# Patient Record
Sex: Female | Born: 1960 | Race: White | Hispanic: No | Marital: Married
Health system: Southern US, Community
[De-identification: ages and names within clinical notes are randomized; demographics above are authoritative.]

## PROBLEM LIST (undated history)

## (undated) HISTORY — PX: ABDOMINAL HYSTERECTOMY: SHX81

---

## 2000-01-24 ENCOUNTER — Other Ambulatory Visit: Admission: RE | Admit: 2000-01-24 | Discharge: 2000-01-24 | Payer: Self-pay | Admitting: Family Medicine

## 2002-01-18 ENCOUNTER — Other Ambulatory Visit: Admission: RE | Admit: 2002-01-18 | Discharge: 2002-01-18 | Payer: Self-pay | Admitting: Obstetrics and Gynecology

## 2003-07-05 ENCOUNTER — Other Ambulatory Visit: Admission: RE | Admit: 2003-07-05 | Discharge: 2003-07-05 | Payer: Self-pay | Admitting: Obstetrics and Gynecology

## 2003-07-27 ENCOUNTER — Ambulatory Visit (HOSPITAL_COMMUNITY): Admission: RE | Admit: 2003-07-27 | Discharge: 2003-07-27 | Payer: Self-pay | Admitting: Obstetrics and Gynecology

## 2003-08-29 ENCOUNTER — Encounter (INDEPENDENT_AMBULATORY_CARE_PROVIDER_SITE_OTHER): Payer: Self-pay | Admitting: *Deleted

## 2003-08-29 ENCOUNTER — Observation Stay (HOSPITAL_COMMUNITY): Admission: RE | Admit: 2003-08-29 | Discharge: 2003-08-30 | Payer: Self-pay | Admitting: Obstetrics and Gynecology

## 2003-08-29 DIAGNOSIS — Z90710 Acquired absence of both cervix and uterus: Secondary | ICD-10-CM | POA: Insufficient documentation

## 2009-04-28 ENCOUNTER — Emergency Department (HOSPITAL_COMMUNITY): Admission: EM | Admit: 2009-04-28 | Discharge: 2009-04-28 | Payer: Self-pay | Admitting: Emergency Medicine

## 2010-12-23 LAB — URINALYSIS, ROUTINE W REFLEX MICROSCOPIC
Bilirubin Urine: NEGATIVE
Glucose, UA: NEGATIVE mg/dL
Ketones, ur: NEGATIVE mg/dL
Leukocytes, UA: NEGATIVE
Nitrite: POSITIVE — AB
Protein, ur: NEGATIVE mg/dL
Specific Gravity, Urine: >1.03 — ABNORMAL HIGH (ref 1.005–1.030)
Urobilinogen, UA: 0.2 mg/dL (ref 0.0–1.0)
pH: 5 (ref 5.0–8.0)

## 2010-12-23 LAB — URINE CULTURE: Colony Count: >=100000

## 2010-12-23 LAB — URINE MICROSCOPIC-ADD ON

## 2011-02-01 NOTE — Op Note (Signed)
Sheila Arnold, Sheila Arnold                           ACCOUNT NO.:  [id number]   MEDICAL RECORD NO.:  [id number]                   PATIENT TYPE:  OBV   LOCATION:  9302                                 FACILITY:  [facility]   PHYSICIAN:  Maxie Better, M.D.            DATE OF BIRTH:  01/13/1961   DATE OF PROCEDURE:  08/29/2003  DATE OF DISCHARGE:                                 OPERATIVE REPORT   PREOPERATIVE DIAGNOSIS:  Second-degree uterine prolapse.   PROCEDURE:  Total vaginal hysterectomy.   POSTOPERATIVE DIAGNOSIS:  Second-degree uterine prolapse.   ANESTHESIA:  General.   SURGEON:  Maxie Better, M.D.   ASSISTANT:  Pershing Cox, M.D.   INDICATIONS:  This is a 50 year old gravida 2, para 2, married, white female  with symptomatic uterine prolapse, who now presents for surgical management.  Risks and benefits of the procedure have been explained to the patient.  Consent was signed.  The patient was transferred to the operating room.   DESCRIPTION OF PROCEDURE:  Under adequate general anesthesia, the patient  was placed in the dorsal lithotomy position.  Examination under anesthesia  revealed an 8 weeks size, anteverted uterus; no adnexal masses could be  appreciated.  The patient was sterilely prepped and draped in the usual  fashion.  An indwelling Foley catheter was then placed.  A weighted speculum  was placed in the vagina.  A vaginal retractor was then used anteriorly.  The cervical os, which was gaping, was noted to be at the entrance of the  vagina.  The anterior lip and the posterior lip of the cervix was grasped  with Brooke Pace clamps.  The cervicovaginal junction was then  recognized/identified.  A circumferential incision was then made at the  cervicovaginal junction.  Using Mayo scissors, sharp dissection was then  used anteriorly to open the anterior cul-de-sac.  Posteriorly, after  dissection superiorly, the posterior cul-de-sac was then opened.  The  uterosacral ligaments were bilaterally clamped, cut, and suture ligated with  0 Vicryl suture bilaterally.  Using the long LigaSure, the  cardinal  ligaments were bilaterally clamped, cauterized, and cut.  The uterine  vessels were bilaterally clamped, cauterized, and cut.  This procedure was  continued up until the level of the uteroovarian ligaments was reached, at  which time decision was then made to transect the specimen in the midline up  to the fundal portion without completely cutting it in half.  This allowed  for the left uteroovarian ligament to be identified which was then serially  clamped, cut using the LigaSure.  This procedure is performed also on the  contralateral side.  Normal tubes and ovaries are noted bilaterally.  The  posterior vaginal cuff was then noted to have some bleeding.  This was  hemostased with cauterization, and the posterior vaginal cuff __________  with 0 Vicryl running lock stitch.  It was noted that bilaterally the  uterosacral ligaments  were slightly attenuated particularly on the left, and  this was reinforced with 0 Vicryl figure-of-eight sutures being placed at  the previous uterosacral ligament pedicle and tied to the preexisting one.  A McCall culdoplasty was then done from the uterosacral ligaments on the  left to the right using 0 Vicryl x 2.  This was then tied x 2 in the midline  to decrease enterocele formation.  The peritoneum anteriorly was then  identified and brought up to the level of the side of the vagina anteriorly.  This was then affixed to the anterior aspect of the vagina using 0 Vicryl  single suture.  The pedicles were inspected.  Good hemostasis was noted.  The decision was then made to close the vaginal cuff.  This was closed  vertically using interrupted 0 Vicryl figure-of-eights and single sutures.  The uterosacral ligaments were then tied in the midline prior to complete  closure of the vagina.  The specimen was the uterus  with the cervix,  bivalve.  The vagina was inspected.  Good hemostasis was noted at which time  all instruments were then removed from the vagina.  Specimen was, as stated,  uterus and cervix.  Estimated blood loss was about 75-100 mL.  Intraoperative fluid was 1200 mL crystalloid.  Urine output was 200 mL clear  yellow urine.  Sponge and instruments counts x 2 is correct.  Complication  was none.  The patient tolerated the procedure well, was transferred to the  recovery room in stable condition.                                               Maxie Better, M.D.    [place]/MEDQ  D:  08/29/2003  T:  08/29/2003  Job:  [id number]

## 2011-02-01 NOTE — Op Note (Signed)
NAMEJAIDYNN, Sheila Arnold                           ACCOUNT NO.:  [id number]   MEDICAL RECORD NO.:  [id number]                   PATIENT TYPE:  OBV   LOCATION:  9302                                 [facility]   PHYSICIAN:  Maxie Better, M.D.            DATE OF BIRTH:  02/15/1961   DATE OF PROCEDURE:  08/29/2003  DATE OF DISCHARGE:                                 OPERATIVE REPORT   PREOPERATIVE DIAGNOSIS:  Second-degree uterine prolapse.   PROCEDURE:  Total vaginal hysterectomy.   POSTOPERATIVE DIAGNOSIS:  Second-degree uterine prolapse.   ANESTHESIA:  General.   SURGEON:  Maxie Better, M.D.   ASSISTANT:  Pershing Cox, M.D.   INDICATIONS:  A 50 year old gravida 2, para 2 female, with symptomatic  uterine prolapse is now being admitted for surgical management.  Risks and  benefits of the procedure have been explained to the patient.  Consent was  signed.  The patient was transferred to the operating room.   DESCRIPTION OF PROCEDURE:  Under adequate general anesthesia, the patient  was placed in the dorsal lithotomy position.  She was sterilely prepped and  draped in the usual fashion for a vaginal hysterectomy.  An indwelling Foley  catheter was sterilely placed.  Bimanual examination revealed anteverted  uterus about 8 weeks size.  No adnexal masses could be appreciated.  A  weighted speculum was placed in the vagina.  A vaginal retractor was then  used anteriorly.  The cervix was noted to be at the entrance to the vagina  and with notable gaping, cervical os.  The anterior and posterior lip of the  cervix was grasped with Brooke Pace clamps on the anterior and posterior lip.  Traction was then placed, and countertraction to assess the junction between  the vagina and the cervix.  A dilute solution of Pitressin was injected  circumferentially around the cervicovaginal junction.  A circumferential  incision was then made and then superiorly using Mayo scissors, the  anterior  cul-de-sac was subsequently opened.  The posterior cul-de-sac after  dissection up superiorly was then opened.  The uterosacral ligaments were  then bilaterally clamped, cut, and suture ligated with 0 Vicryl Heaney  sutures.  The LigaSure  Dictation ends at this point.                                               Maxie Better, M.D.    [place]/MEDQ  D:  08/29/2003  T:  08/29/2003  Job:  [id number]

## 2011-02-01 NOTE — H&P (Signed)
Sheila Arnold, Sheila Arnold                           ACCOUNT NO.:  [id number]   MEDICAL RECORD NO.:  [id number]                   PATIENT TYPE:  OBV   LOCATION:  NA                                   [facility]   PHYSICIAN:  Maxie Better, M.D.            DATE OF BIRTH:  27-Jan-1961   DATE OF ADMISSION:  DATE OF DISCHARGE:                                HISTORY & PHYSICAL   ANTICIPATED DATE OF ADMISSION AND SURGERY:  August 29, 2003.   CHIEF COMPLAINT:  Uterine prolapse and intramenstrual bleed.   HISTORY OF PRESENT ILLNESS:  This is a 50 year old gravida 2,  para 2  married white female whose last menstrual period was August 08, 2003 and  is being admitted for a total vaginal hysterectomy secondary to second-  degree uterine prolapse.  The patient has no associated urinary  incontinence.  She presents with complaints of postcoital bleeding and  intramenstrual bleeding.  Her evaluation included endometrial biopsy on  July 21, 2003 that showed proliferative endometrium.  Pap smear July 05, 2003 was normal.  Ultrasound on July 19, 2003 showed a thickened  endometrium with an endometrial stripe of 1.1 cm, normal ovaries and normal  uterus.  Sonohystogram  was performed; no endometrial polyps or endometrial  cavity lesions noted.   The patient was offered conservative management including a pessary, which  she declined.  Her husband has had a vasectomy.   ALLERGIES:  No known drug allergies.   MEDICATIONS:  Medicines are multivitamins.   PAST MEDICAL HISTORY:  Migraine headaches.   PAST SURGICAL HISTORY:  Right shoulder surgery.   PAST OBSTETRICAL HISTORY:  Vaginal delivery times two.   FAMILY HISTORY:  Mother; diabetes.  No genital, colon or ovarian cancer.   SOCIAL HISTORY:  Married.  Two children.  Nonsmoker.  Homemaker.   REVIEW OF SYSTEMS:  Review of systems is negative, except as noted in the  history of present illness.   PHYSICAL EXAMINATION:   GENERAL APPEARANCE:  Well-developed, well-nourished  white female in no acute distress.  VITAL SIGNS:  Blood pressure 120/74, pulse of 86, weight 171 pounds, and  height 5 feet 3 inches.  SKIN:  Skin shows no lesions.  HEENT EXAMINATION:  Anicteric sclerae.  Pink conjunctivae.  Oropharynx  negative.  HEART:  Heart is regular rate and rhythm without murmur.  LUNGS:  Lungs are clear to auscultation.  BREASTS:  Breasts are soft and nontender with no palpable mass.  Upright  nipples.  NECK:  Neck is supple.  No thyroid palpable.  NODES:  No supraclavicular, axillary or inguinal nodes palpable.  BACK:  No CVA tenderness.  ABDOMEN:  Abdomen soft and nontender.  No palpable mass.  PELVIC EXAMINATION:  Vulva shows no lesions.  Vagina has good rugae.  Second-  degree uterine prolapse with Valsalva.  Cervix has a gaping os, otherwise  normal.  Uterus is anteverted, normal and  nontender.  Adnexa nontender, no  palpable mass.  RECTAL:  Rectal exam deferred, although protuberant hemorrhoid noted.   IMPRESSION:  Second-degree uterine prolapse without any other associated  pelvic relaxation disorder.   PLAN:  1. Admission.  2. Total vaginal hysterectomy with preservation of ovarian function.  3. Routine admission labs and orders.  4. Antibiotics for prophylaxis.  5. Antiembolic stockings.   The risks of the procedure have been explained to the patient including, but  not limited to infection, bleeding, bleeding that may require blood  transfusion, risks of blood transfusion, HIV transmission, acute reaction,  hepatitis transmission, injury to surrounding organ structures such as the  bladder, bowel or ureters, fistula formation, bowel obstruction, internal  scar tissue that may lead to pelvic pain, preservation of the ovaries, pros  and cons discussed including possible surgery in the future for ovarian  cysts as well as ovarian cancer, inability to complete the procedure  vaginally and need  for transabdominal route discussed, and no further menses  were discussed.  Postop care and criteria for discharge home were reviewed.  All questions were answered.                                               Maxie Better, M.D.    [place]/MEDQ  D:  08/28/2003  T:  08/28/2003  Job:  [id number]

## 2013-07-07 ENCOUNTER — Other Ambulatory Visit (HOSPITAL_COMMUNITY): Payer: Self-pay | Admitting: Family Medicine

## 2013-07-07 DIAGNOSIS — M810 Age-related osteoporosis without current pathological fracture: Secondary | ICD-10-CM

## 2013-07-07 DIAGNOSIS — Z139 Encounter for screening, unspecified: Secondary | ICD-10-CM

## 2013-07-20 ENCOUNTER — Ambulatory Visit (HOSPITAL_COMMUNITY): Payer: Self-pay

## 2013-07-20 ENCOUNTER — Other Ambulatory Visit (HOSPITAL_COMMUNITY): Payer: Self-pay

## 2013-08-25 ENCOUNTER — Telehealth: Payer: Self-pay

## 2013-08-25 NOTE — Telephone Encounter (Signed)
Pt was referred by Marin Olp , NP, for screening colonoscopy. She has constipation and has been scheduled appt for OV with Merleen Nicely, NP on 09/23/2013 at 3:00 PM.

## 2013-08-26 ENCOUNTER — Ambulatory Visit (HOSPITAL_COMMUNITY)
Admission: RE | Admit: 2013-08-26 | Discharge: 2013-08-26 | Disposition: A | Discharge status: Home / Self Care | Payer: BC Managed Care – PPO | Source: Ambulatory Visit | Attending: Family Medicine | Admitting: Family Medicine

## 2013-08-26 DIAGNOSIS — Z139 Encounter for screening, unspecified: Secondary | ICD-10-CM

## 2013-08-26 DIAGNOSIS — Z1231 Encounter for screening mammogram for malignant neoplasm of breast: Secondary | ICD-10-CM | POA: Insufficient documentation

## 2013-09-02 ENCOUNTER — Other Ambulatory Visit (HOSPITAL_COMMUNITY): Payer: Self-pay | Admitting: Family Medicine

## 2013-09-02 DIAGNOSIS — R928 Other abnormal and inconclusive findings on diagnostic imaging of breast: Secondary | ICD-10-CM

## 2013-09-22 ENCOUNTER — Encounter (HOSPITAL_COMMUNITY): Payer: BC Managed Care – PPO

## 2013-09-23 ENCOUNTER — Ambulatory Visit: Payer: Self-pay | Admitting: Gastroenterology

## 2015-09-14 ENCOUNTER — Other Ambulatory Visit (HOSPITAL_COMMUNITY): Payer: Self-pay | Admitting: Adult Health Nurse Practitioner

## 2015-09-14 DIAGNOSIS — E785 Hyperlipidemia, unspecified: Secondary | ICD-10-CM | POA: Insufficient documentation

## 2015-09-14 DIAGNOSIS — Z1231 Encounter for screening mammogram for malignant neoplasm of breast: Secondary | ICD-10-CM

## 2015-09-14 DIAGNOSIS — K5900 Constipation, unspecified: Secondary | ICD-10-CM | POA: Insufficient documentation

## 2015-09-19 ENCOUNTER — Other Ambulatory Visit (HOSPITAL_COMMUNITY): Payer: Self-pay | Admitting: Adult Health Nurse Practitioner

## 2015-09-19 DIAGNOSIS — R229 Localized swelling, mass and lump, unspecified: Principal | ICD-10-CM

## 2015-09-19 DIAGNOSIS — IMO0002 Reserved for concepts with insufficient information to code with codable children: Secondary | ICD-10-CM

## 2015-09-22 ENCOUNTER — Ambulatory Visit (HOSPITAL_COMMUNITY): Payer: Self-pay

## 2015-09-28 ENCOUNTER — Other Ambulatory Visit (HOSPITAL_COMMUNITY): Payer: Self-pay | Admitting: Adult Health Nurse Practitioner

## 2015-09-28 DIAGNOSIS — R229 Localized swelling, mass and lump, unspecified: Principal | ICD-10-CM

## 2015-09-28 DIAGNOSIS — IMO0002 Reserved for concepts with insufficient information to code with codable children: Secondary | ICD-10-CM

## 2015-10-03 ENCOUNTER — Ambulatory Visit (HOSPITAL_COMMUNITY)
Admission: RE | Admit: 2015-10-03 | Discharge: 2015-10-03 | Disposition: A | Discharge status: Home / Self Care | Payer: BLUE CROSS/BLUE SHIELD | Source: Ambulatory Visit | Attending: Adult Health Nurse Practitioner | Admitting: Adult Health Nurse Practitioner

## 2015-10-03 DIAGNOSIS — R229 Localized swelling, mass and lump, unspecified: Secondary | ICD-10-CM

## 2015-10-03 DIAGNOSIS — IMO0002 Reserved for concepts with insufficient information to code with codable children: Secondary | ICD-10-CM

## 2015-10-03 DIAGNOSIS — N63 Unspecified lump in breast: Secondary | ICD-10-CM | POA: Diagnosis not present

## 2015-10-03 DIAGNOSIS — R928 Other abnormal and inconclusive findings on diagnostic imaging of breast: Secondary | ICD-10-CM | POA: Diagnosis present

## 2016-06-17 DIAGNOSIS — E669 Obesity, unspecified: Secondary | ICD-10-CM | POA: Insufficient documentation

## 2018-02-19 DIAGNOSIS — M545 Low back pain, unspecified: Secondary | ICD-10-CM | POA: Insufficient documentation

## 2018-08-20 ENCOUNTER — Other Ambulatory Visit (HOSPITAL_COMMUNITY): Payer: Self-pay | Admitting: Adult Health Nurse Practitioner

## 2018-08-20 DIAGNOSIS — Z1231 Encounter for screening mammogram for malignant neoplasm of breast: Secondary | ICD-10-CM

## 2018-08-31 ENCOUNTER — Ambulatory Visit (HOSPITAL_COMMUNITY)
Admission: RE | Admit: 2018-08-31 | Discharge: 2018-08-31 | Disposition: A | Discharge status: Home / Self Care | Payer: BLUE CROSS/BLUE SHIELD | Source: Ambulatory Visit | Attending: Adult Health Nurse Practitioner | Admitting: Adult Health Nurse Practitioner

## 2018-08-31 DIAGNOSIS — Z1231 Encounter for screening mammogram for malignant neoplasm of breast: Secondary | ICD-10-CM | POA: Diagnosis present

## 2018-08-31 DIAGNOSIS — M2550 Pain in unspecified joint: Secondary | ICD-10-CM | POA: Insufficient documentation

## 2020-03-05 IMAGING — MG DIGITAL SCREENING BILATERAL MAMMOGRAM WITH TOMO AND CAD
8 series · 8 of 24 positions shown · non-contrast
Comparison: Previous exam(s).

CLINICAL DATA: Screening.

EXAM:
DIGITAL SCREENING BILATERAL MAMMOGRAM WITH TOMO AND CAD

[R MLO synth-2D]
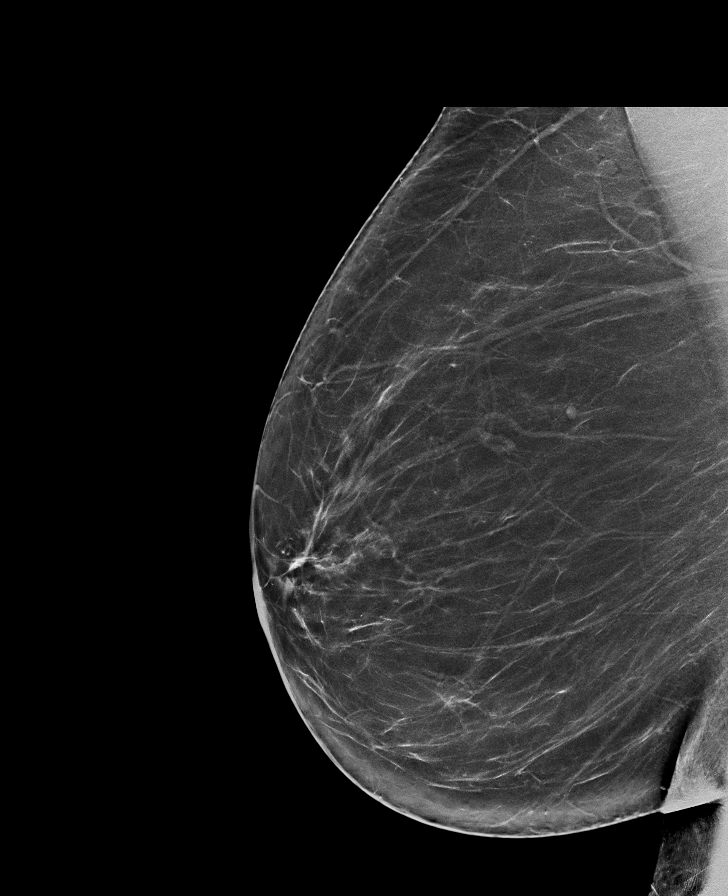

[L CC synth-2D]
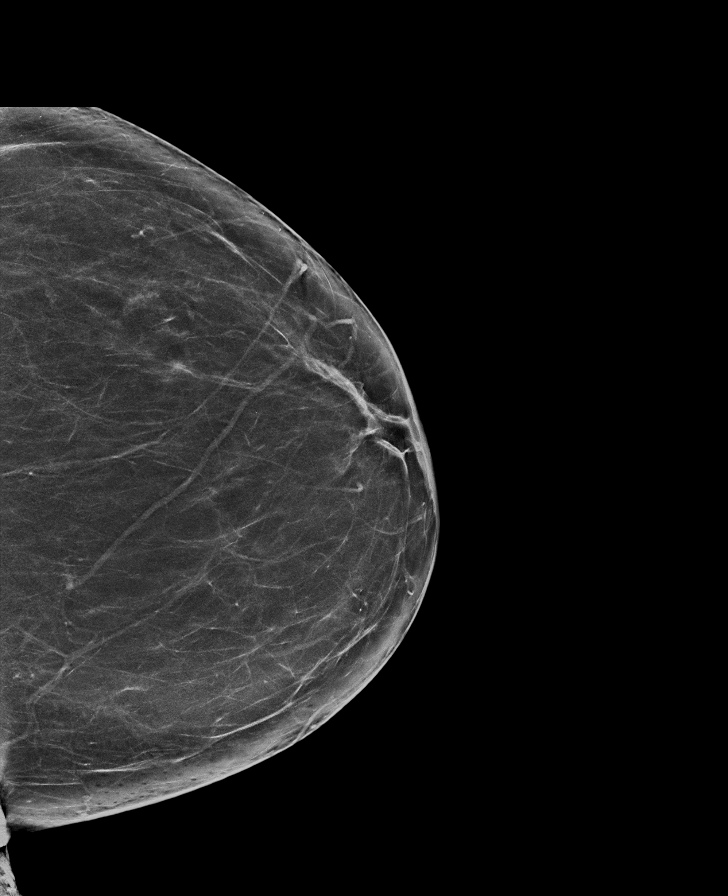

[L MLO synth-2D]
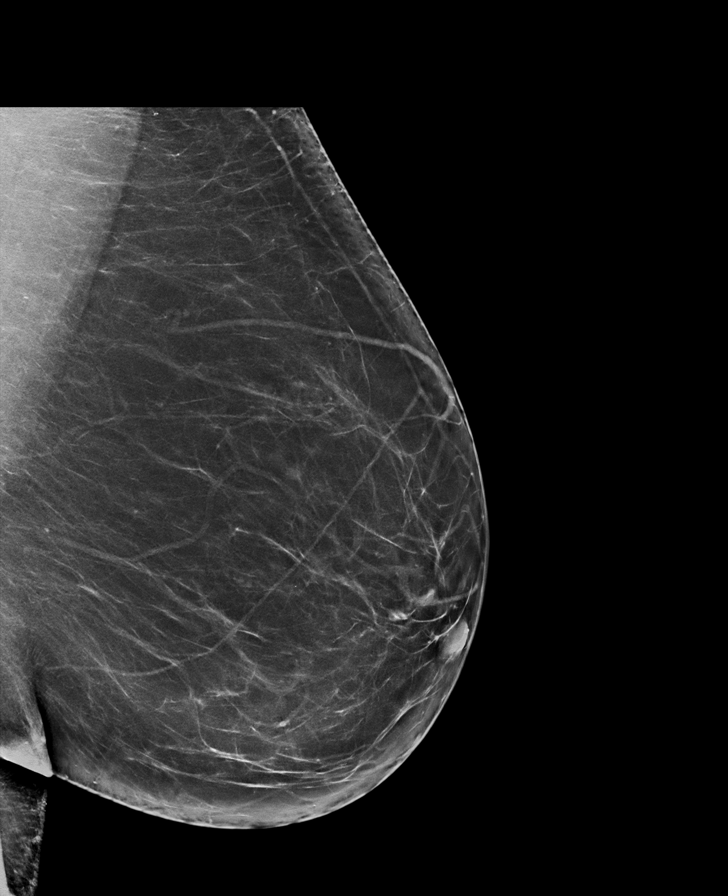

[R CC synth-2D]
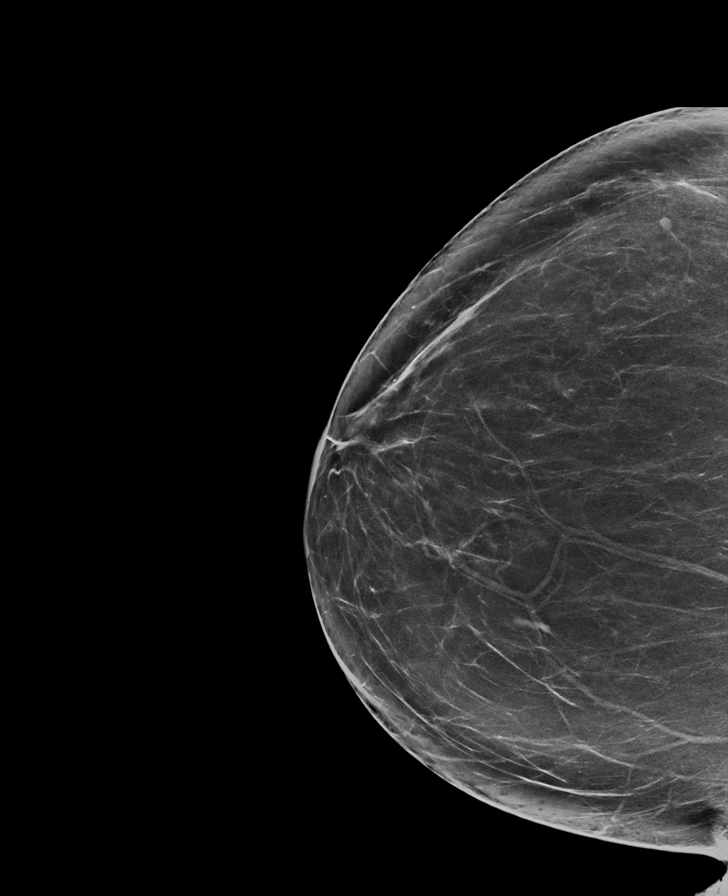

[R CC tomo · tomo slice 39/78.0]
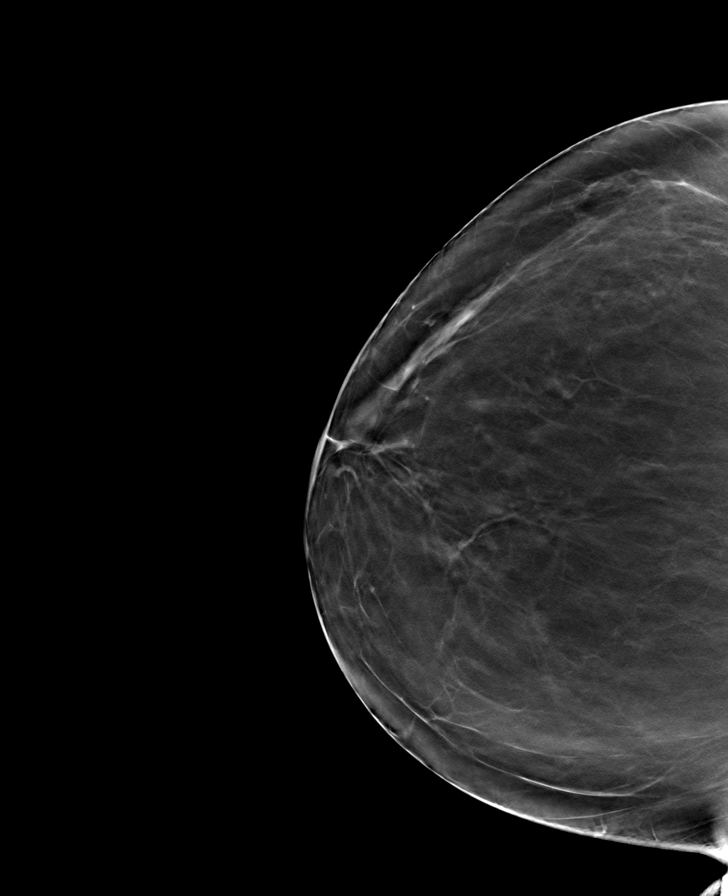

[R MLO tomo · tomo slice 41/81.0]
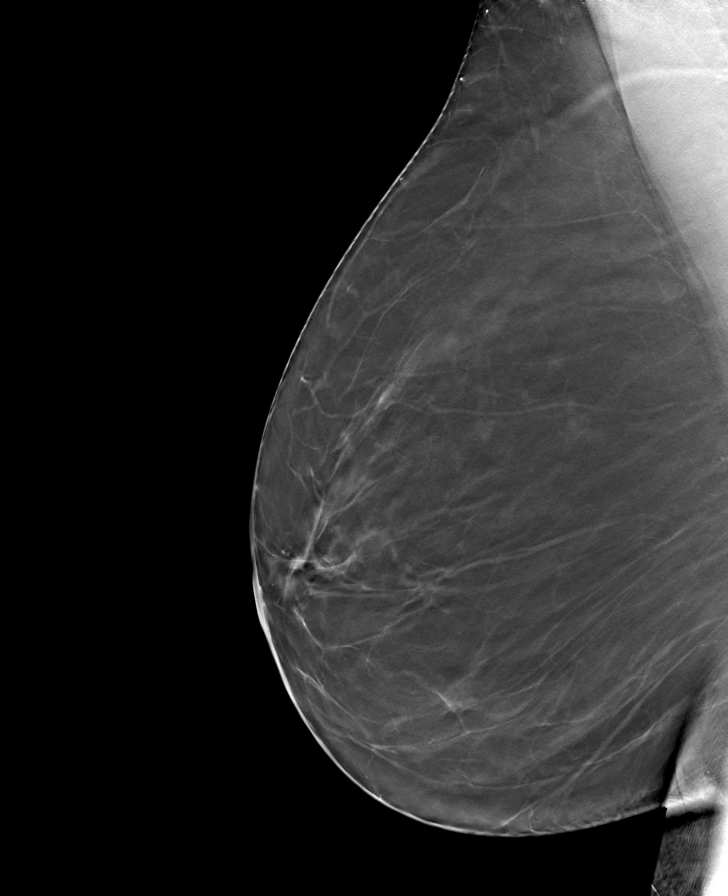

[L MLO tomo · tomo slice 41/81.0]
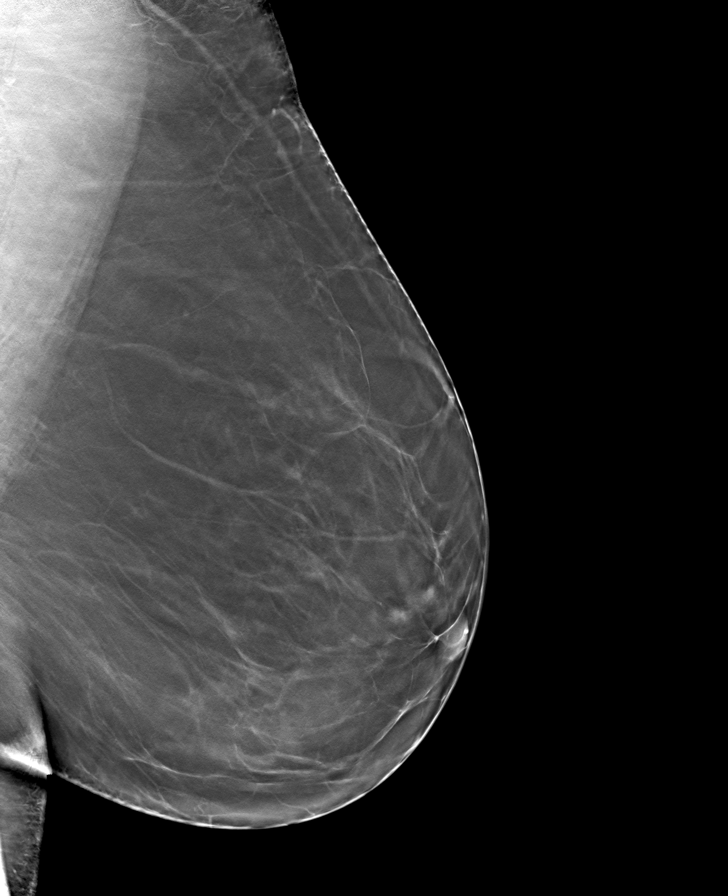

[L CC tomo · tomo slice 37/72.0]
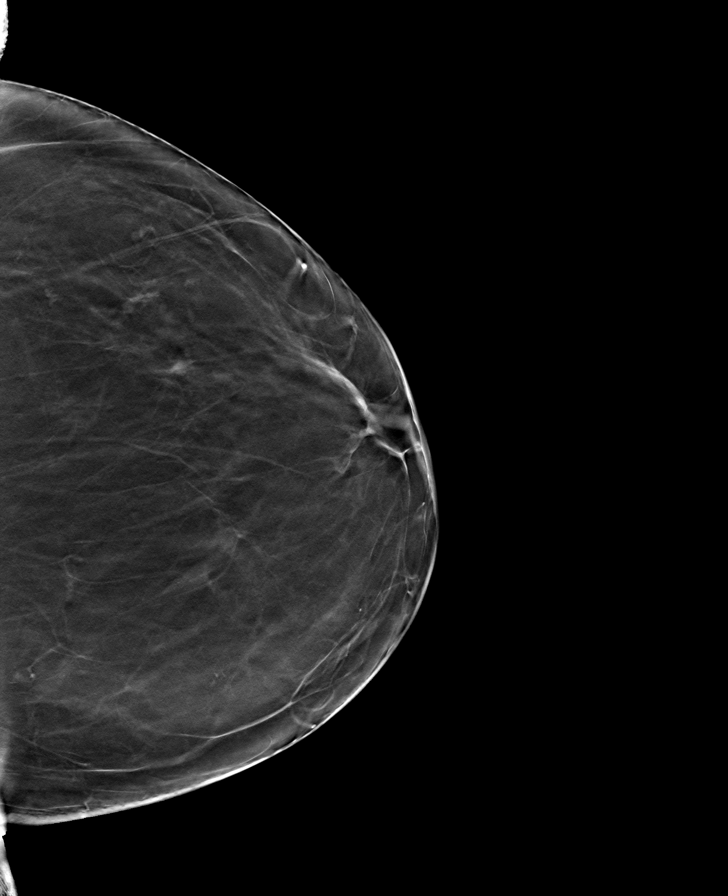

[8 of 24 positions shown; findings below may reference images not displayed]

ACR Breast Density Category b: There are scattered areas of
fibroglandular density.
FINDINGS: There are no findings suspicious for malignancy. Images were
processed with CAD.
IMPRESSION: No mammographic evidence of malignancy. A result letter of this
screening mammogram will be mailed directly to the patient.

RECOMMENDATION:
Screening mammogram in one year. (Code:CN-U-775)

BI-RADS CATEGORY  1: Negative.

## 2023-01-10 ENCOUNTER — Ambulatory Visit: Payer: Self-pay | Admitting: Family Medicine

## 2023-01-27 ENCOUNTER — Ambulatory Visit: Payer: Self-pay | Admitting: Family Medicine

## 2023-03-21 ENCOUNTER — Ambulatory Visit: Payer: Self-pay | Admitting: Family Medicine

## 2023-04-15 ENCOUNTER — Encounter: Payer: Self-pay | Admitting: *Deleted

## 2023-07-01 ENCOUNTER — Ambulatory Visit: Payer: Self-pay | Admitting: Family Medicine

## 2023-07-11 ENCOUNTER — Ambulatory Visit (INDEPENDENT_AMBULATORY_CARE_PROVIDER_SITE_OTHER): Payer: BC Managed Care – PPO | Admitting: Family Medicine

## 2023-07-11 ENCOUNTER — Encounter: Payer: Self-pay | Admitting: Family Medicine

## 2023-07-11 VITALS — BP 96/65 | HR 101 | Temp 98.5°F | Ht 62.0 in | Wt 190.0 lb

## 2023-07-11 DIAGNOSIS — M2550 Pain in unspecified joint: Secondary | ICD-10-CM | POA: Diagnosis not present

## 2023-07-11 DIAGNOSIS — Z23 Encounter for immunization: Secondary | ICD-10-CM | POA: Diagnosis not present

## 2023-07-11 DIAGNOSIS — E669 Obesity, unspecified: Secondary | ICD-10-CM

## 2023-07-11 DIAGNOSIS — Z0001 Encounter for general adult medical examination with abnormal findings: Secondary | ICD-10-CM

## 2023-07-11 DIAGNOSIS — Z1211 Encounter for screening for malignant neoplasm of colon: Secondary | ICD-10-CM | POA: Diagnosis not present

## 2023-07-11 DIAGNOSIS — Z90710 Acquired absence of both cervix and uterus: Secondary | ICD-10-CM

## 2023-07-11 DIAGNOSIS — E785 Hyperlipidemia, unspecified: Secondary | ICD-10-CM | POA: Diagnosis not present

## 2023-07-11 DIAGNOSIS — Z7689 Persons encountering health services in other specified circumstances: Secondary | ICD-10-CM

## 2023-07-11 NOTE — Progress Notes (Signed)
New Patient Office Visit  Subjective   Patient ID: Sheila Arnold, female    DOB: 06-26-1961  Age: 62 y.o. MRN: [id number]  CC:  Chief Complaint  Patient presents with   New Patient (Initial Visit)    Physical/labwork    HPI Sheila Arnold presents to establish care Concerns today include: 1. States that her former provider moved and she has not followed up since then (2019).   Occupation: homemaker  Marital status: married  Diet: soup, chicken, no vegetables!, bananas, strawberries, blackberries  Exercise: walking  Substance use: none  Last eye exam: states that it was before covid. History of cataract surgery.  Last dental exam: none - used to go to [facility].  Last colonoscopy: never done - would do cologuard  Last mammogram: missed a few years. Does not wish to screen.  Last pap smear: partial hysterectomy. Dr. Cherly Hensen in [place]  Contraceptive: as above.  Other specialists seen: orthopedics  Dermatology exam: none, declines  Fasting today:  crackers all day.   Immunizations needed: Flu Vaccine: yes  Tdap Vaccine: no  - every 40yrs - (<3 lifetime doses or unknown): all wounds -- look up need for Tetanus IG - (>=3 lifetime doses): clean/minor wound if >44yrs from previous; all other wounds if >45yrs from previous Zoster Vaccine: declines (those >50yo, once) Pneumonia Vaccine: not due  (those w/ risk factors) - (<56yr) Both: Immunocompromised, cochlear implant, CSF leak, asplenic, sickle cell, Chronic Renal Failure - (<72yr) PPSV-23 only: Heart dz, lung disease, DM, tobacco abuse, alcoholism, cirrhosis/liver disease. - (>49yr): PPSV13 then PPSV23 in 6-12mths;  - (>31yr): repeat PPSV23 once if pt received prior to 65yo and 34yrs have passed  Outpatient Encounter Medications as of 07/11/2023  Medication Sig   meloxicam (MOBIC) 7.5 MG tablet Take by mouth.   No facility-administered encounter medications on file as of 07/11/2023.    No past medical history on  file.  Past Surgical History:  Procedure Laterality Date   ABDOMINAL HYSTERECTOMY      Family History  Problem Relation Age of Onset   Diabetes Mother    Diabetes Sister    Diabetes Brother     Social History   Socioeconomic History   Marital status: Married    Spouse name: Not on file   Number of children: Not on file   Years of education: Not on file   Highest education level: Not on file  Occupational History   Not on file  Tobacco Use   Smoking status: Never   Smokeless tobacco: Never  Substance and Sexual Activity   Alcohol use: Never   Drug use: Never   Sexual activity: Yes  Other Topics Concern   Not on file  Social History Narrative   Not on file   Social Determinants of Health   Financial Resource Strain: Not on file  Food Insecurity: Not on file  Transportation Needs: Not on file  Physical Activity: Not on file  Stress: Not on file  Social Connections: Unknown (04/12/2022)   [facility]   Social Network    Social Network: Not on file  Intimate Partner Violence: Unknown (04/12/2022)   Received from [facility]   HITS    Physically Hurt: Not on file    Insult or Talk Down To: Not on file    Threaten Physical Harm: Not on file    Scream or Curse: Not on file    ROS As per HPI  Objective   BP  96/65   Pulse (!) 101   Temp 98.5 F (36.9 C)   Ht 5\' 2"  (1.575 m)   Wt 190 lb (86.2 kg)   SpO2 98%   BMI 34.75 kg/m   Physical Exam Constitutional:      General: She is awake. She is not in acute distress.    Appearance: Normal appearance. She is well-developed and well-groomed. She is obese. She is not ill-appearing, toxic-appearing or diaphoretic.  Cardiovascular:     Rate and Rhythm: Normal rate and regular rhythm.     Pulses: Normal pulses.          Radial pulses are 2+ on the right side and 2+ on the left side.       Posterior tibial pulses are 2+ on the right side and 2+ on the left side.     Heart sounds: Normal heart  sounds. No murmur heard.    No gallop.  Pulmonary:     Effort: Pulmonary effort is normal. No respiratory distress.     Breath sounds: Normal breath sounds. No stridor. No wheezing, rhonchi or rales.  Musculoskeletal:     Cervical back: Full passive range of motion without pain and neck supple.     Right lower leg: No edema.     Left lower leg: No edema.  Skin:    General: Skin is warm.     Capillary Refill: Capillary refill takes less than 2 seconds.  Neurological:     General: No focal deficit present.     Mental Status: She is alert, oriented to person, place, and time and easily aroused. Mental status is at baseline.     GCS: GCS eye subscore is 4. GCS verbal subscore is 5. GCS motor subscore is 6.     Motor: No weakness.  Psychiatric:        Attention and Perception: Attention and perception normal.        Mood and Affect: Mood and affect normal.        Speech: Speech normal.        Behavior: Behavior normal. Behavior is cooperative.        Thought Content: Thought content normal. Thought content does not include homicidal or suicidal ideation. Thought content does not include homicidal or suicidal plan.        Cognition and Memory: Cognition and memory normal.        Judgment: Judgment normal.       07/11/2023    3:45 PM  Depression screen PHQ 2/9  Decreased Interest 0  Down, Depressed, Hopeless 0  PHQ - 2 Score 0  Altered sleeping 0  Tired, decreased energy 0  Change in appetite 0  Feeling bad or failure about yourself  0  Trouble concentrating 0  Moving slowly or fidgety/restless 0  Suicidal thoughts 0  PHQ-9 Score 0  Difficult doing work/chores Not difficult at all      07/11/2023    3:46 PM  GAD 7 : Generalized Anxiety Score  Nervous, Anxious, on Edge 0  Control/stop worrying 0  Worry too much - different things 0  Trouble relaxing 0  Restless 0  Easily annoyed or irritable 0  Afraid - awful might happen 0  Total GAD 7 Score 0  Anxiety Difficulty Not  difficult at all   Assessment & Plan:  1. Encounter to establish care Discussed with patient to continue healthy lifestyle choices, including diet (rich in fruits, vegetables, and lean proteins, and low in salt and  simple carbohydrates) and exercise (at least 30 minutes of moderate physical activity daily). Limit beverages high is sugar. Recommended at least 80-100 oz of water daily.   2. Obesity (BMI 30-39.9) Labs as below. Will communicate results to patient once available. Will await results to determine next steps.  - CBC with Differential/Platelet - CMP14+EGFR - TSH - VITAMIN D 25 Hydroxy (Vit-D Deficiency, Fractures)  3. Arthralgia, unspecified joint Managed with meloxicam. Continue current regimen.   4. Dyslipidemia Labs as below. Will communicate results to patient once available. Will await results to determine next steps.  Crackers  - Lipid panel  5. Screening for colon cancer - Cologuard  6. History of total vaginal hysterectomy Given history, PAP is not indicated. Reviewed note from [facility], 08/29/2003.   The above assessment and management plan was discussed with the patient. The patient verbalized understanding of and has agreed to the management plan using shared-decision making. Patient is aware to call the clinic if they develop any new symptoms or if symptoms fail to improve or worsen. Patient is aware when to return to the clinic for a follow-up visit. Patient educated on when it is appropriate to go to the emergency department.   Return in about 1 year (around 07/10/2024) for CPE.   Neale Burly, DNP-FNP [facility] [address] [place], [place] [address] [phone]

## 2023-07-12 LAB — CMP14+EGFR
ALT: 21 [IU]/L (ref 0–32)
AST: 15 [IU]/L (ref 0–40)
Albumin: 4.3 g/dL (ref 3.9–4.9)
Alkaline Phosphatase: 91 [IU]/L (ref 44–121)
BUN/Creatinine Ratio: 21 (ref 12–28)
BUN: 19 mg/dL (ref 8–27)
Bilirubin Total: 0.4 mg/dL (ref 0.0–1.2)
CO2: 24 mmol/L (ref 20–29)
Calcium: 9.7 mg/dL (ref 8.7–10.3)
Chloride: 100 mmol/L (ref 96–106)
Creatinine, Ser: 0.89 mg/dL (ref 0.57–1.00)
Globulin, Total: 2.4 g/dL (ref 1.5–4.5)
Glucose: 153 mg/dL — ABNORMAL HIGH (ref 70–99)
Potassium: 5.2 mmol/L (ref 3.5–5.2)
Sodium: 137 mmol/L (ref 134–144)
Total Protein: 6.7 g/dL (ref 6.0–8.5)
eGFR: 72 mL/min/{1.73_m2} (ref >59)

## 2023-07-12 LAB — CBC WITH DIFFERENTIAL/PLATELET
Basophils Absolute: 0 10*3/uL (ref 0.0–0.2)
Basos: 0 %
EOS (ABSOLUTE): 0.2 10*3/uL (ref 0.0–0.4)
Eos: 2 %
Hematocrit: 42.3 % (ref 34.0–46.6)
Hemoglobin: 13.5 g/dL (ref 11.1–15.9)
Immature Grans (Abs): 0.1 10*3/uL (ref 0.0–0.1)
Immature Granulocytes: 1 %
Lymphocytes Absolute: 1.7 10*3/uL (ref 0.7–3.1)
Lymphs: 17 %
MCH: 29.9 pg (ref 26.6–33.0)
MCHC: 31.9 g/dL (ref 31.5–35.7)
MCV: 94 fL (ref 79–97)
Monocytes Absolute: 0.7 10*3/uL (ref 0.1–0.9)
Monocytes: 8 %
Neutrophils Absolute: 7 10*3/uL (ref 1.4–7.0)
Neutrophils: 72 %
Platelets: 267 10*3/uL (ref 150–450)
RBC: 4.51 x10E6/uL (ref 3.77–5.28)
RDW: 12.1 % (ref 11.7–15.4)
WBC: 9.8 10*3/uL (ref 3.4–10.8)

## 2023-07-12 LAB — LIPID PANEL
Chol/HDL Ratio: 3.1 ratio (ref 0.0–4.4)
Cholesterol, Total: 198 mg/dL (ref 100–199)
HDL: 64 mg/dL (ref >39)
LDL Chol Calc (NIH): 118 mg/dL — ABNORMAL HIGH (ref 0–99)
Triglycerides: 89 mg/dL (ref 0–149)
VLDL Cholesterol Cal: 16 mg/dL (ref 5–40)

## 2023-07-12 LAB — TSH: TSH: 2.65 u[IU]/mL (ref 0.450–4.500)

## 2023-07-12 LAB — VITAMIN D 25 HYDROXY (VIT D DEFICIENCY, FRACTURES): Vit D, 25-Hydroxy: 24 ng/mL — ABNORMAL LOW (ref 30.0–100.0)

## 2023-07-18 NOTE — Addendum Note (Signed)
Addended by: Neale Burly on: 07/18/2023 01:18 PM   Modules accepted: Level of Service

## 2023-07-22 NOTE — Progress Notes (Signed)
Can we check on A1C?   Slightly elevated LDL. Recommend lifestyle changes. Avoid fried, greasy, and fatty foods. Avoid fast foods. Exercise encouraged - at least 150 minutes per week and advance as tolerated. Can also try red yeast rice and we will recheck in 3 months. ASCVD risk score remains low. Slightly decreased vitamin D. Recommend intake of [phone] international units daily.  The 10-year ASCVD risk score (Arnett DK, et al., 2019) is: 2.7%   Values used to calculate the score:     Age: 62 years     Sex: Female     Is Non-Hispanic African American: No     Diabetic: No     Tobacco smoker: No     Systolic Blood Pressure: 96 mmHg     Is BP treated: No     HDL Cholesterol: 64 mg/dL     Total Cholesterol: 198 mg/dL
